# Patient Record
Sex: Female | Born: 1985 | Race: Black or African American | Hispanic: No | Marital: Single | Smoking: Never smoker
Health system: Southern US, Community
[De-identification: ages and names within clinical notes are randomized; demographics above are authoritative.]

## PROBLEM LIST (undated history)

## (undated) DIAGNOSIS — D571 Sickle-cell disease without crisis: Secondary | ICD-10-CM

## (undated) DIAGNOSIS — I10 Essential (primary) hypertension: Secondary | ICD-10-CM

## (undated) DIAGNOSIS — I639 Cerebral infarction, unspecified: Secondary | ICD-10-CM

---
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
## 2009-05-11 ENCOUNTER — Inpatient Hospital Stay (HOSPITAL_COMMUNITY): Admission: EM | Admit: 2009-05-11 | Discharge: 2009-05-13 | Payer: Self-pay | Admitting: Emergency Medicine

## 2010-05-23 LAB — CBC
HCT: 19.6 % — ABNORMAL LOW (ref 36.0–46.0)
HCT: 25.7 % — ABNORMAL LOW (ref 36.0–46.0)
Hemoglobin: 5.4 g/dL — CL (ref 12.0–15.0)
Hemoglobin: 6.6 g/dL — CL (ref 12.0–15.0)
Hemoglobin: 8.7 g/dL — ABNORMAL LOW (ref 12.0–15.0)
MCHC: 33.5 g/dL (ref 30.0–36.0)
MCHC: 34.1 g/dL (ref 30.0–36.0)
MCV: 106.1 fL — ABNORMAL HIGH (ref 78.0–100.0)
MCV: 106.7 fL — ABNORMAL HIGH (ref 78.0–100.0)
Platelets: 258 10*3/uL (ref 150–400)
Platelets: 331 10*3/uL (ref 150–400)
RBC: 1.5 MIL/uL — ABNORMAL LOW (ref 3.87–5.11)
RBC: 1.85 MIL/uL — ABNORMAL LOW (ref 3.87–5.11)
RDW: 20.2 % — ABNORMAL HIGH (ref 11.5–15.5)
RDW: 20.4 % — ABNORMAL HIGH (ref 11.5–15.5)
WBC: 12.4 10*3/uL — ABNORMAL HIGH (ref 4.0–10.5)
WBC: 14.1 10*3/uL — ABNORMAL HIGH (ref 4.0–10.5)
WBC: 18.3 10*3/uL — ABNORMAL HIGH (ref 4.0–10.5)

## 2010-05-23 LAB — CULTURE, BLOOD (ROUTINE X 2)

## 2010-05-23 LAB — DIFFERENTIAL
Basophils Absolute: 0 10*3/uL (ref 0.0–0.1)
Basophils Absolute: 0.1 10*3/uL (ref 0.0–0.1)
Basophils Relative: 0 % (ref 0–1)
Eosinophils Absolute: 0 10*3/uL (ref 0.0–0.7)
Eosinophils Relative: 0 % (ref 0–5)
Eosinophils Relative: 1 % (ref 0–5)
Lymphocytes Relative: 2 % — ABNORMAL LOW (ref 12–46)
Lymphocytes Relative: 34 % (ref 12–46)
Lymphs Abs: 0.4 10*3/uL — ABNORMAL LOW (ref 0.7–4.0)
Lymphs Abs: 4.8 10*3/uL — ABNORMAL HIGH (ref 0.7–4.0)
Monocytes Absolute: 1.8 10*3/uL — ABNORMAL HIGH (ref 0.1–1.0)
Monocytes Relative: 10 % (ref 3–12)
Monocytes Relative: 13 % — ABNORMAL HIGH (ref 3–12)
Neutro Abs: 16.1 10*3/uL — ABNORMAL HIGH (ref 1.7–7.7)
Neutro Abs: 7.3 10*3/uL (ref 1.7–7.7)
Neutrophils Relative %: 88 % — ABNORMAL HIGH (ref 43–77)

## 2010-05-23 LAB — URINALYSIS, ROUTINE W REFLEX MICROSCOPIC
Bilirubin Urine: NEGATIVE
Glucose, UA: NEGATIVE mg/dL
Ketones, ur: NEGATIVE mg/dL
Nitrite: NEGATIVE
pH: 7.5 (ref 5.0–8.0)

## 2010-05-23 LAB — CROSSMATCH

## 2010-05-23 LAB — COMPREHENSIVE METABOLIC PANEL
ALT: 20 U/L (ref 0–35)
ALT: 44 U/L — ABNORMAL HIGH (ref 0–35)
AST: 47 U/L — ABNORMAL HIGH (ref 0–37)
Albumin: 3.8 g/dL (ref 3.5–5.2)
Albumin: 4.5 g/dL (ref 3.5–5.2)
Alkaline Phosphatase: 38 U/L — ABNORMAL LOW (ref 39–117)
Alkaline Phosphatase: 39 U/L (ref 39–117)
BUN: 5 mg/dL — ABNORMAL LOW (ref 6–23)
BUN: 8 mg/dL (ref 6–23)
CO2: 26 mEq/L (ref 19–32)
Calcium: 9.3 mg/dL (ref 8.4–10.5)
Chloride: 106 mEq/L (ref 96–112)
Chloride: 106 mEq/L (ref 96–112)
Creatinine, Ser: 0.64 mg/dL (ref 0.4–1.2)
GFR calc Af Amer: >60 mL/min (ref >60)
GFR calc non Af Amer: >60 mL/min (ref >60)
Glucose, Bld: 132 mg/dL — ABNORMAL HIGH (ref 70–99)
Potassium: 3.7 mEq/L (ref 3.5–5.1)
Potassium: 3.9 mEq/L (ref 3.5–5.1)
Sodium: 139 mEq/L (ref 135–145)
Sodium: 141 mEq/L (ref 135–145)
Total Bilirubin: 7.4 mg/dL — ABNORMAL HIGH (ref 0.3–1.2)
Total Bilirubin: 9.5 mg/dL — ABNORMAL HIGH (ref 0.3–1.2)
Total Protein: 7 g/dL (ref 6.0–8.3)
Total Protein: 7.5 g/dL (ref 6.0–8.3)

## 2010-05-23 LAB — IRON AND TIBC: TIBC: 234 ug/dL — ABNORMAL LOW (ref 250–470)

## 2010-05-23 LAB — LIPASE, BLOOD: Lipase: 23 U/L (ref 11–59)

## 2010-05-23 LAB — VITAMIN B12: Vitamin B-12: 820 pg/mL (ref 211–911)

## 2010-05-23 LAB — RETICULOCYTES
RBC.: 2.69 MIL/uL — ABNORMAL LOW (ref 3.87–5.11)
Retic Count, Absolute: 133.5 10*3/uL (ref 19.0–186.0)
Retic Count, Absolute: 349.7 10*3/uL — ABNORMAL HIGH (ref 19.0–186.0)
Retic Ct Pct: 7.1 % — ABNORMAL HIGH (ref 0.4–3.1)
Retic Ct Pct: 13 % — ABNORMAL HIGH (ref 0.4–3.1)

## 2010-05-23 LAB — PATHOLOGIST SMEAR REVIEW

## 2010-05-23 LAB — LACTIC ACID, PLASMA: Lactic Acid, Venous: 1.9 mmol/L (ref 0.5–2.2)

## 2010-05-23 LAB — URINE CULTURE

## 2010-05-23 LAB — URINE MICROSCOPIC-ADD ON

## 2010-05-23 LAB — FOLATE: Folate: 14.9 ng/mL

## 2010-05-23 LAB — FERRITIN: Ferritin: 371 ng/mL — ABNORMAL HIGH (ref 10–291)

## 2010-12-09 IMAGING — CR DG ABDOMEN ACUTE W/ 1V CHEST
3 series · 3 of 3 positions shown · non-contrast
Comparison: None.

CLINICAL DATA: Nausea.  Vomiting.  Diarrhea.  Abdominal pain.
History of splenectomy.

ACUTE ABDOMEN SERIES (ABDOMEN 2 VIEW & CHEST 1 VIEW)

[w chest pa]
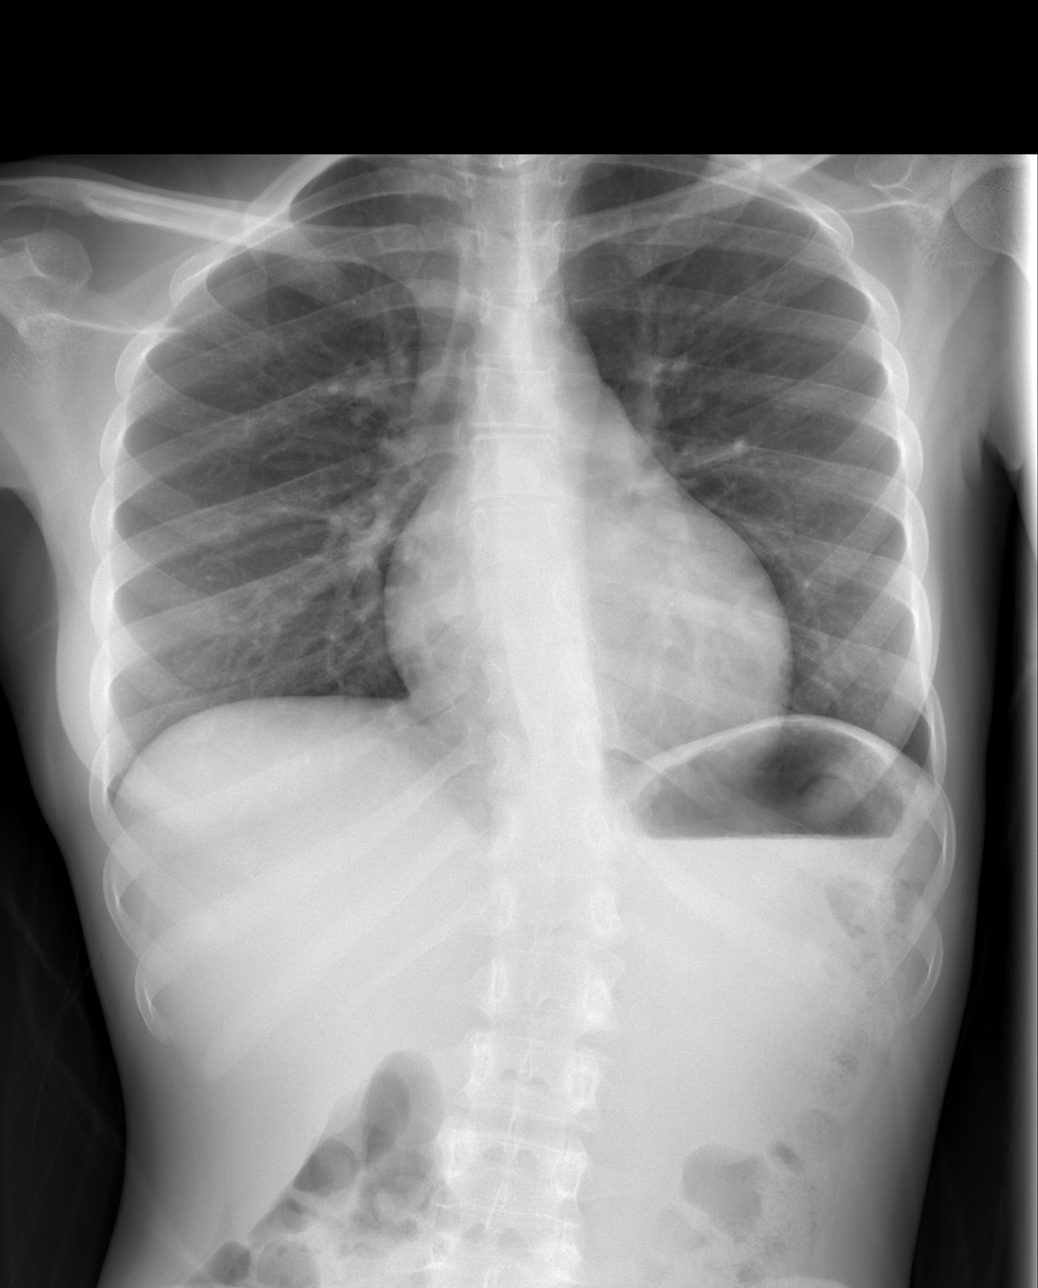

[w abdomen upright *]
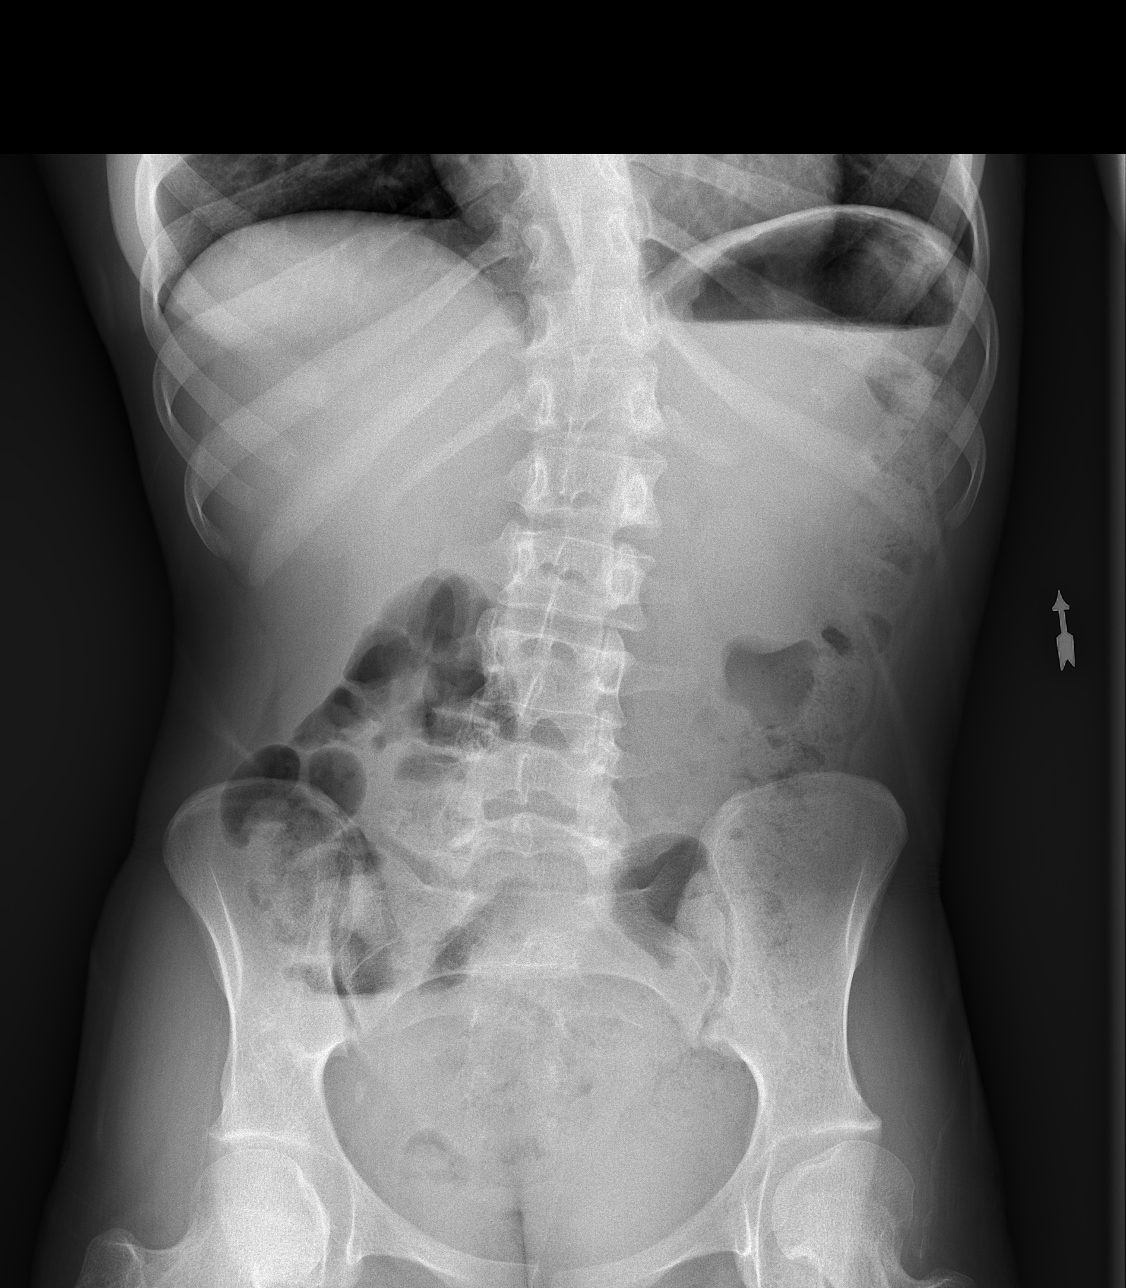

[t abdomen supine]
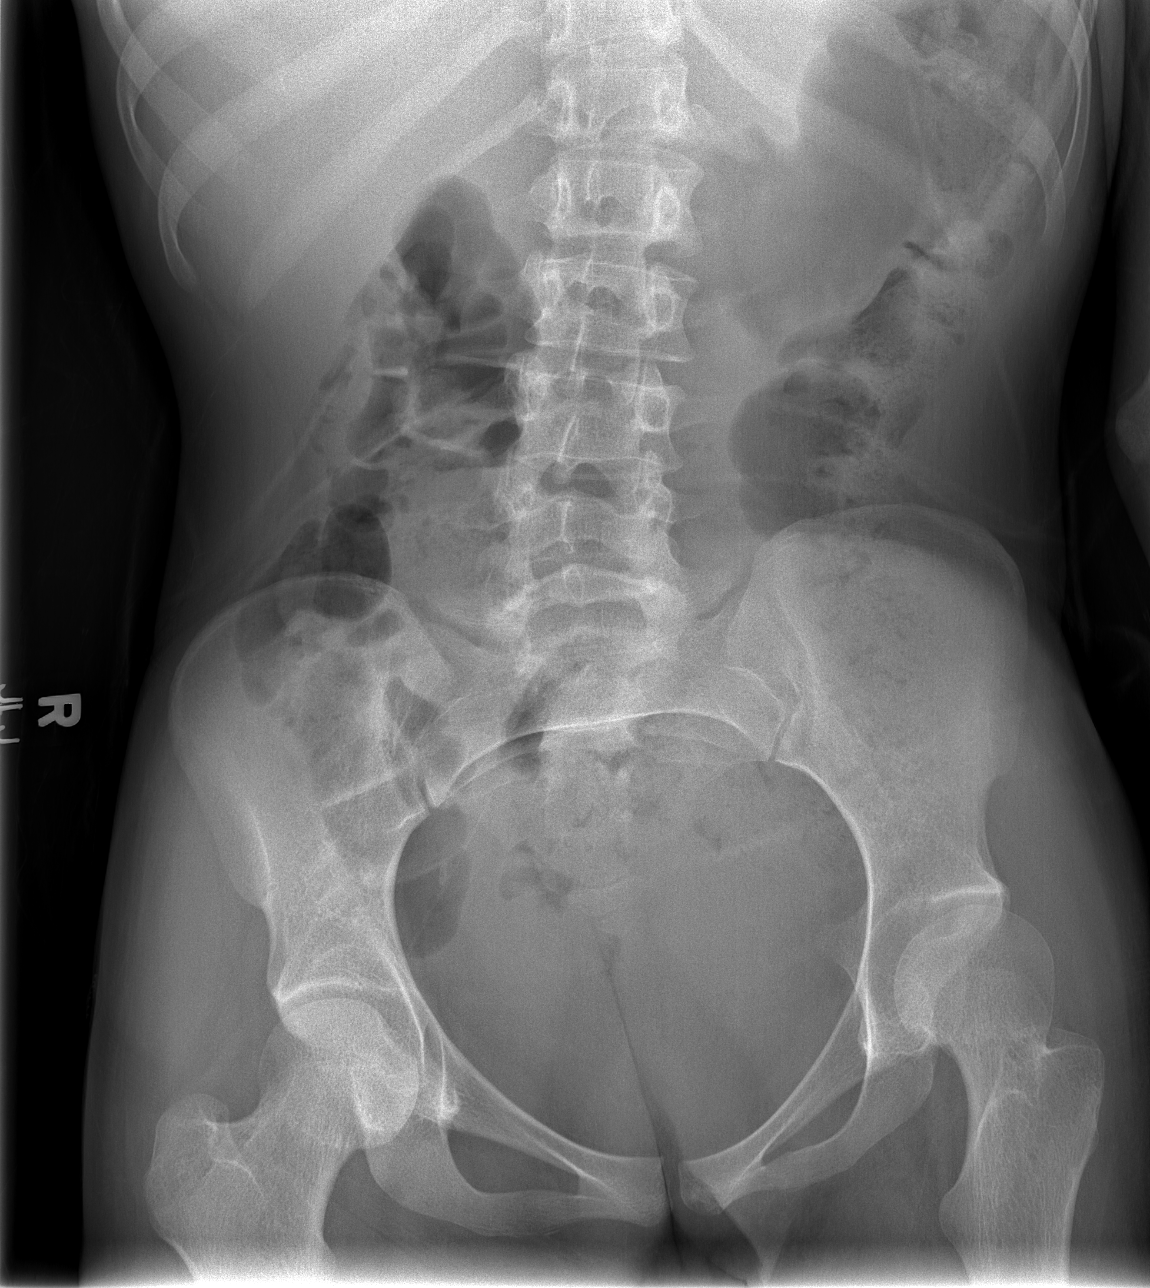

[3 of 3 positions shown; findings below may reference images not displayed]

FINDINGS: The cardiac silhouette is normal size and shape. There is
thoracolumbar scoliosis with primary lumbar curve convexity to the
left. The lungs are well aerated and free of infiltrates. No
pleural abnormality is evident.

No pneumoperitoneum is seen.  There is fluid distention of the
stomach.  The bowel gas pattern of the colon and small intestine is
nonobstructive.  No opaque calculus or abnormal calcifications are
evident.  Small phlebolith is seen in the right side of the pelvis.
There is a paucity of bowel gas in the pelvis.  I cannot exclude a
pelvic process.
IMPRESSION: Scoliosis.  No cardiopulmonary disease is evident.

No pneumoperitoneum is evident.  There is fluid distention of the
stomach.  Colonic and small intestinal bowel gas pattern is
nonobstructive.  No opaque calculus is seen.  There is a paucity of
bowel gas in the pelvis.  I cannot exclude a pelvic process.

## 2012-10-07 ENCOUNTER — Encounter (HOSPITAL_COMMUNITY): Payer: Self-pay | Admitting: Emergency Medicine

## 2012-10-07 ENCOUNTER — Encounter (HOSPITAL_COMMUNITY): Payer: Self-pay

## 2012-10-07 ENCOUNTER — Emergency Department (HOSPITAL_COMMUNITY)
Admission: EM | Admit: 2012-10-07 | Discharge: 2012-10-07 | Discharge status: Against Medical Advice | Payer: Medicaid Other | Attending: Emergency Medicine | Admitting: Emergency Medicine

## 2012-10-07 ENCOUNTER — Emergency Department (INDEPENDENT_AMBULATORY_CARE_PROVIDER_SITE_OTHER)
Admission: EM | Admit: 2012-10-07 | Discharge: 2012-10-07 | Disposition: A | Discharge status: Home / Self Care | Payer: Medicaid Other | Source: Home / Self Care

## 2012-10-07 DIAGNOSIS — D62 Acute posthemorrhagic anemia: Secondary | ICD-10-CM

## 2012-10-07 DIAGNOSIS — Z8673 Personal history of transient ischemic attack (TIA), and cerebral infarction without residual deficits: Secondary | ICD-10-CM | POA: Insufficient documentation

## 2012-10-07 DIAGNOSIS — D571 Sickle-cell disease without crisis: Secondary | ICD-10-CM | POA: Insufficient documentation

## 2012-10-07 DIAGNOSIS — R5381 Other malaise: Secondary | ICD-10-CM | POA: Insufficient documentation

## 2012-10-07 DIAGNOSIS — N898 Other specified noninflammatory disorders of vagina: Secondary | ICD-10-CM | POA: Insufficient documentation

## 2012-10-07 DIAGNOSIS — D5700 Hb-SS disease with crisis, unspecified: Secondary | ICD-10-CM

## 2012-10-07 HISTORY — DX: Cerebral infarction, unspecified: I63.9

## 2012-10-07 HISTORY — DX: Essential (primary) hypertension: I10

## 2012-10-07 HISTORY — DX: Sickle-cell disease without crisis: D57.1

## 2012-10-07 LAB — POCT URINALYSIS DIP (DEVICE)
Bilirubin Urine: NEGATIVE
Ketones, ur: NEGATIVE mg/dL
Protein, ur: 100 mg/dL — AB
Specific Gravity, Urine: 1.015 (ref 1.005–1.030)
pH: 7 (ref 5.0–8.0)

## 2012-10-07 LAB — POCT I-STAT, CHEM 8
BUN: 5 mg/dL — ABNORMAL LOW (ref 6–23)
Calcium, Ion: 1.28 mmol/L — ABNORMAL HIGH (ref 1.12–1.23)
Chloride: 108 mEq/L (ref 96–112)
Creatinine, Ser: 1.1 mg/dL (ref 0.50–1.10)
Glucose, Bld: 104 mg/dL — ABNORMAL HIGH (ref 70–99)
HCT: 25 % — ABNORMAL LOW (ref 36.0–46.0)
Potassium: 3.8 mEq/L (ref 3.5–5.1)

## 2012-10-07 NOTE — ED Notes (Signed)
Mother came to nurse first desk x2. RN explained to mother that she was concerned about daughter and her weakness and vaginal bleeding, but daughter was at this point stable and RN will continue to monitor. Mother angry that TRiage RN did not ask enough questions. Mother states, "she did not ask [place] enough questions she just got what she wanted from the paper. She does not know how weak and sick my daughter is. She had a stroke and she has sickle cell and her hemoglobin is 8.5 and you people are not concerned" RN explained the we are concerned and that she will continue to monitor patients condition. Mother states, "you are treated like I am naive and I am not naive. I have a masters degree and I am registered dietician. IT is because I have an accent." Rn apologized and explained that she was trying to explain things the way she does to everyone whether they are medical or not. RN offered to get charge nurse. Mother was angry and left.

## 2012-10-07 NOTE — ED Notes (Signed)
States she missed her appt for depo at [facility], and states she has has been having  Bleeding for 2 weeks , and she is feeling weak and tired; history of sickle cell and stroke

## 2012-10-07 NOTE — ED Notes (Signed)
Mother stated, she was sent down here cause she has had prolonged vaginal bleeding.

## 2012-10-07 NOTE — ED Provider Notes (Signed)
CSN: [id number]     Arrival date & time 10/07/12  1135 History     First MD Initiated Contact with Patient 10/07/12 1240     Chief Complaint  Patient presents with  . Menstrual Problem   (Consider location/radiation/quality/duration/timing/severity/associated sxs/prior Treatment) HPI Comments: 27 year old female presents for evaluation of weakness, fatigue, and dizziness. [facility] and therefore missed her Depo Provera shot. Her period has been going on for 2 weeks now. Yesterday and today, she has begun to feel very weak and has had some episodes of dizziness. She has a history of sickle cell anemia but is not currently in any pain. She states that her period has slowed down and may have stopped but she still feels her weak and dizzy. Denies chest pain, headache, shortness of breath, abdominal pain   Past Medical History  Diagnosis Date  . Sickle cell disease   . Stroke   . Hypertension    History reviewed. No pertinent past surgical history. History reviewed. No pertinent family history. History  Substance Use Topics  . Smoking status: Not on file  . Smokeless tobacco: Not on file  . Alcohol Use: Not on file   OB History   Grav Para Term Preterm Abortions TAB SAB Ect Mult Living                 Review of Systems  Constitutional: Positive for fatigue. Negative for fever and chills.  Eyes: Negative for visual disturbance.  Respiratory: Negative for cough and shortness of breath.   Cardiovascular: Negative for chest pain, palpitations and leg swelling.  Gastrointestinal: Negative for nausea, vomiting and abdominal pain.  Endocrine: Negative for polydipsia and polyuria.  Genitourinary: Positive for menstrual problem. Negative for dysuria, urgency and frequency.  Musculoskeletal: Negative for myalgias and arthralgias.  Skin: Negative for rash.  Neurological: Positive for dizziness, weakness and light-headedness.    Allergies  Review of  patient's allergies indicates no known allergies.  Home Medications   Current Outpatient Rx  Name  Route  Sig  Dispense  Refill  . enalapril (VASOTEC) 10 MG tablet   Oral   Take 10 mg by mouth daily.         . folic acid (FOLVITE) 1 MG tablet   Oral   Take 1 mg by mouth daily.         . hydroxyurea (HYDREA) 500 MG capsule   Oral   Take 500 mg by mouth daily. May take with food to minimize GI side effects.         . risperiDONE (RISPERDAL) 0.25 MG tablet   Oral   Take 0.25 mg by mouth 2 (two) times daily.          BP 130/82  Pulse 121  Temp(Src) 98.3 F (36.8 C) (Oral)  Resp 12  SpO2 100% Physical Exam  Nursing note and vitals reviewed. Constitutional: She is oriented to person, place, and time. Vital signs are normal. She appears well-developed and well-nourished. No distress.  HENT:  Head: Atraumatic.  Eyes: EOM are normal. Pupils are equal, round, and reactive to light.  The conjunctiva are slightly pale  Cardiovascular: Normal rate, regular rhythm and normal heart sounds.  Exam reveals no gallop and no friction rub.   No murmur heard. Pulmonary/Chest: Effort normal and breath sounds normal. No respiratory distress. She has no wheezes. She has no rales.  Abdominal: Soft. There is no tenderness.  Neurological: She is alert and oriented  to person, place, and time. She has normal strength.  Skin: Skin is warm and dry. She is not diaphoretic.  Psychiatric: Her behavior is normal. Judgment normal. Her affect is blunt (strange, slightly blunted affect, this is normal for the patient).    ED Course   Procedures (including critical care time)  Labs Reviewed  POCT URINALYSIS DIP (DEVICE) - Abnormal; Notable for the following:    Hgb urine dipstick LARGE (*)    Protein, ur 100 (*)    Urobilinogen, UA 2.0 (*)    Leukocytes, UA TRACE (*)    All other components within normal limits  POCT I-STAT, CHEM 8 - Abnormal; Notable for the following:    BUN 5 (*)     Glucose, Bld 104 (*)    Calcium, Ion 1.28 (*)    Hemoglobin 8.5 (*)    HCT 25.0 (*)    All other components within normal limits  POCT PREGNANCY, URINE   No results found. 1. Sickle cell anemia, with unspecified crisis   2. Acute blood loss anemia     MDM  The hemoglobin is 8.5, probably a bit lower than that because our i-STAT machine reads a bit high. She is orthostatic as well. She is being transferred to the emergency department   Graylon Good, PA-C 10/07/12 1435

## 2012-10-07 NOTE — ED Notes (Signed)
Did not answer when called x2 

## 2012-10-10 NOTE — ED Provider Notes (Signed)
Medical screening examination/treatment/procedure(s) were performed by a resident physician or non-physician practitioner and as the supervising physician I was immediately available for consultation/collaboration.  Clementeen Graham, MD   Rodolph Bong, MD 10/10/12 [phone]

## 2016-05-16 DIAGNOSIS — F29 Unspecified psychosis not due to a substance or known physiological condition: Secondary | ICD-10-CM | POA: Diagnosis not present

## 2016-08-10 DIAGNOSIS — R110 Nausea: Secondary | ICD-10-CM | POA: Diagnosis not present

## 2016-08-10 DIAGNOSIS — D571 Sickle-cell disease without crisis: Secondary | ICD-10-CM | POA: Diagnosis not present

## 2016-08-10 DIAGNOSIS — R808 Other proteinuria: Secondary | ICD-10-CM | POA: Diagnosis not present

## 2016-08-10 DIAGNOSIS — Z79899 Other long term (current) drug therapy: Secondary | ICD-10-CM | POA: Diagnosis not present

## 2017-05-29 DIAGNOSIS — Z79899 Other long term (current) drug therapy: Secondary | ICD-10-CM | POA: Diagnosis not present

## 2017-05-29 DIAGNOSIS — R801 Persistent proteinuria, unspecified: Secondary | ICD-10-CM | POA: Diagnosis not present

## 2017-05-29 DIAGNOSIS — F329 Major depressive disorder, single episode, unspecified: Secondary | ICD-10-CM | POA: Diagnosis not present

## 2017-05-29 DIAGNOSIS — R808 Other proteinuria: Secondary | ICD-10-CM | POA: Diagnosis not present

## 2017-05-29 DIAGNOSIS — E559 Vitamin D deficiency, unspecified: Secondary | ICD-10-CM | POA: Diagnosis not present

## 2017-05-29 DIAGNOSIS — Z8673 Personal history of transient ischemic attack (TIA), and cerebral infarction without residual deficits: Secondary | ICD-10-CM | POA: Diagnosis not present

## 2017-05-29 DIAGNOSIS — D571 Sickle-cell disease without crisis: Secondary | ICD-10-CM | POA: Diagnosis not present

## 2017-05-29 DIAGNOSIS — R809 Proteinuria, unspecified: Secondary | ICD-10-CM | POA: Diagnosis not present

## 2017-05-29 DIAGNOSIS — Z9081 Acquired absence of spleen: Secondary | ICD-10-CM | POA: Diagnosis not present

## 2017-05-29 DIAGNOSIS — Z23 Encounter for immunization: Secondary | ICD-10-CM | POA: Diagnosis not present

## 2017-05-29 DIAGNOSIS — Z5181 Encounter for therapeutic drug level monitoring: Secondary | ICD-10-CM | POA: Diagnosis not present

## 2017-07-31 DIAGNOSIS — Z79899 Other long term (current) drug therapy: Secondary | ICD-10-CM | POA: Diagnosis not present

## 2017-07-31 DIAGNOSIS — Z9081 Acquired absence of spleen: Secondary | ICD-10-CM | POA: Diagnosis not present

## 2017-07-31 DIAGNOSIS — E559 Vitamin D deficiency, unspecified: Secondary | ICD-10-CM | POA: Diagnosis not present

## 2017-07-31 DIAGNOSIS — Z8673 Personal history of transient ischemic attack (TIA), and cerebral infarction without residual deficits: Secondary | ICD-10-CM | POA: Diagnosis not present

## 2017-07-31 DIAGNOSIS — Z5181 Encounter for therapeutic drug level monitoring: Secondary | ICD-10-CM | POA: Diagnosis not present

## 2017-07-31 DIAGNOSIS — F329 Major depressive disorder, single episode, unspecified: Secondary | ICD-10-CM | POA: Diagnosis not present

## 2017-07-31 DIAGNOSIS — D571 Sickle-cell disease without crisis: Secondary | ICD-10-CM | POA: Diagnosis not present

## 2017-07-31 DIAGNOSIS — R808 Other proteinuria: Secondary | ICD-10-CM | POA: Diagnosis not present

## 2017-07-31 DIAGNOSIS — F39 Unspecified mood [affective] disorder: Secondary | ICD-10-CM | POA: Diagnosis not present

## 2017-07-31 DIAGNOSIS — Z23 Encounter for immunization: Secondary | ICD-10-CM | POA: Diagnosis not present

## 2017-08-29 DIAGNOSIS — Z5181 Encounter for therapeutic drug level monitoring: Secondary | ICD-10-CM | POA: Diagnosis not present

## 2017-08-29 DIAGNOSIS — D571 Sickle-cell disease without crisis: Secondary | ICD-10-CM | POA: Diagnosis not present

## 2017-09-18 DIAGNOSIS — F89 Unspecified disorder of psychological development: Secondary | ICD-10-CM | POA: Diagnosis not present

## 2017-09-18 DIAGNOSIS — F251 Schizoaffective disorder, depressive type: Secondary | ICD-10-CM | POA: Diagnosis not present

## 2017-10-24 DIAGNOSIS — D571 Sickle-cell disease without crisis: Secondary | ICD-10-CM | POA: Diagnosis not present

## 2017-10-24 DIAGNOSIS — Z8659 Personal history of other mental and behavioral disorders: Secondary | ICD-10-CM | POA: Diagnosis not present

## 2017-10-24 DIAGNOSIS — R808 Other proteinuria: Secondary | ICD-10-CM | POA: Diagnosis not present

## 2017-10-24 DIAGNOSIS — N051 Unspecified nephritic syndrome with focal and segmental glomerular lesions: Secondary | ICD-10-CM | POA: Diagnosis not present

## 2017-10-30 DIAGNOSIS — F89 Unspecified disorder of psychological development: Secondary | ICD-10-CM | POA: Diagnosis not present

## 2017-10-30 DIAGNOSIS — F251 Schizoaffective disorder, depressive type: Secondary | ICD-10-CM | POA: Diagnosis not present

## 2017-11-13 DIAGNOSIS — F329 Major depressive disorder, single episode, unspecified: Secondary | ICD-10-CM | POA: Diagnosis not present

## 2017-11-13 DIAGNOSIS — R808 Other proteinuria: Secondary | ICD-10-CM | POA: Diagnosis not present

## 2017-11-13 DIAGNOSIS — F39 Unspecified mood [affective] disorder: Secondary | ICD-10-CM | POA: Diagnosis not present

## 2017-11-13 DIAGNOSIS — Z79899 Other long term (current) drug therapy: Secondary | ICD-10-CM | POA: Diagnosis not present

## 2017-11-13 DIAGNOSIS — Z23 Encounter for immunization: Secondary | ICD-10-CM | POA: Diagnosis not present

## 2017-11-13 DIAGNOSIS — Z9081 Acquired absence of spleen: Secondary | ICD-10-CM | POA: Diagnosis not present

## 2017-11-13 DIAGNOSIS — E559 Vitamin D deficiency, unspecified: Secondary | ICD-10-CM | POA: Diagnosis not present

## 2017-11-13 DIAGNOSIS — D571 Sickle-cell disease without crisis: Secondary | ICD-10-CM | POA: Diagnosis not present

## 2017-11-13 DIAGNOSIS — Z5181 Encounter for therapeutic drug level monitoring: Secondary | ICD-10-CM | POA: Diagnosis not present

## 2017-12-26 DIAGNOSIS — F251 Schizoaffective disorder, depressive type: Secondary | ICD-10-CM | POA: Diagnosis not present

## 2017-12-26 DIAGNOSIS — F89 Unspecified disorder of psychological development: Secondary | ICD-10-CM | POA: Diagnosis not present

## 2017-12-29 DIAGNOSIS — Z23 Encounter for immunization: Secondary | ICD-10-CM | POA: Diagnosis not present

## 2018-01-09 DIAGNOSIS — D571 Sickle-cell disease without crisis: Secondary | ICD-10-CM | POA: Diagnosis not present

## 2018-01-09 DIAGNOSIS — H36 Retinal disorders in diseases classified elsewhere: Secondary | ICD-10-CM | POA: Diagnosis not present

## 2018-01-09 DIAGNOSIS — H5213 Myopia, bilateral: Secondary | ICD-10-CM | POA: Diagnosis not present

## 2018-01-30 DIAGNOSIS — D571 Sickle-cell disease without crisis: Secondary | ICD-10-CM | POA: Diagnosis not present

## 2018-01-30 DIAGNOSIS — Z5181 Encounter for therapeutic drug level monitoring: Secondary | ICD-10-CM | POA: Diagnosis not present

## 2018-01-30 DIAGNOSIS — H36 Retinal disorders in diseases classified elsewhere: Secondary | ICD-10-CM | POA: Diagnosis not present

## 2018-01-30 DIAGNOSIS — N051 Unspecified nephritic syndrome with focal and segmental glomerular lesions: Secondary | ICD-10-CM | POA: Diagnosis not present

## 2018-06-13 DIAGNOSIS — F251 Schizoaffective disorder, depressive type: Secondary | ICD-10-CM | POA: Diagnosis not present

## 2019-05-15 ENCOUNTER — Ambulatory Visit: Payer: Medicare HMO | Attending: Family

## 2019-05-15 DIAGNOSIS — Z23 Encounter for immunization: Secondary | ICD-10-CM

## 2019-05-15 NOTE — Progress Notes (Signed)
   Covid-19 Vaccination Clinic  Name:  Jo Ballard    MRN: [id number] DOB: Nov 13, 1985  05/15/2019  [name] was observed post Covid-19 immunization for 15 minutes without incident. She was provided with Vaccine Information Sheet and instruction to access the V-Safe system.   [name] was instructed to call 911 with any severe reactions post vaccine: Marland Kitchen Difficulty breathing  . Swelling of face and throat  . A fast heartbeat  . A bad rash all over body  . Dizziness and weakness   Immunizations Administered    Name Date Dose VIS Date Route   Moderna COVID-19 Vaccine 05/15/2019  2:39 PM 0.5 mL 01/28/2019 Intramuscular   Manufacturer: Moderna   Lot: [id number]   NDC: 85277-824-23

## 2019-06-17 ENCOUNTER — Ambulatory Visit: Payer: Medicare HMO | Attending: Family

## 2019-06-17 DIAGNOSIS — Z23 Encounter for immunization: Secondary | ICD-10-CM

## 2019-06-17 NOTE — Progress Notes (Signed)
   Covid-19 Vaccination Clinic  Name:  [name]    MRN: [id number] DOB: Dec 23, 1985  06/17/2019  Jo Ballard was observed post Covid-19 immunization for 15 minutes without incident. She was provided with Vaccine Information Sheet and instruction to access the V-Safe system.   Ms. Patella was instructed to call 911 with any severe reactions post vaccine: Marland Kitchen Difficulty breathing  . Swelling of face and throat  . A fast heartbeat  . A bad rash all over body  . Dizziness and weakness   Immunizations Administered    Name Date Dose VIS Date Route   Moderna COVID-19 Vaccine 06/17/2019  1:57 PM 0.5 mL 01/2019 Intramuscular   Manufacturer: Moderna   Lot: [id number]   NDC: 18563-149-70

## 2020-03-23 DIAGNOSIS — Z5181 Encounter for therapeutic drug level monitoring: Secondary | ICD-10-CM | POA: Diagnosis not present

## 2020-03-23 DIAGNOSIS — R808 Other proteinuria: Secondary | ICD-10-CM | POA: Diagnosis not present

## 2020-03-23 DIAGNOSIS — D571 Sickle-cell disease without crisis: Secondary | ICD-10-CM | POA: Diagnosis not present

## 2020-03-23 DIAGNOSIS — M8588 Other specified disorders of bone density and structure, other site: Secondary | ICD-10-CM | POA: Diagnosis not present

## 2020-05-05 DIAGNOSIS — F419 Anxiety disorder, unspecified: Secondary | ICD-10-CM | POA: Diagnosis not present

## 2020-05-05 DIAGNOSIS — R69 Illness, unspecified: Secondary | ICD-10-CM | POA: Diagnosis not present

## 2020-06-16 DIAGNOSIS — Z5181 Encounter for therapeutic drug level monitoring: Secondary | ICD-10-CM | POA: Diagnosis not present

## 2020-06-16 DIAGNOSIS — R808 Other proteinuria: Secondary | ICD-10-CM | POA: Diagnosis not present

## 2020-06-16 DIAGNOSIS — D571 Sickle-cell disease without crisis: Secondary | ICD-10-CM | POA: Diagnosis not present

## 2020-07-29 DIAGNOSIS — H5213 Myopia, bilateral: Secondary | ICD-10-CM | POA: Diagnosis not present

## 2020-07-29 DIAGNOSIS — H52223 Regular astigmatism, bilateral: Secondary | ICD-10-CM | POA: Diagnosis not present

## 2020-07-30 DIAGNOSIS — H5213 Myopia, bilateral: Secondary | ICD-10-CM | POA: Diagnosis not present

## 2020-08-10 DIAGNOSIS — R69 Illness, unspecified: Secondary | ICD-10-CM | POA: Diagnosis not present

## 2020-08-10 DIAGNOSIS — F419 Anxiety disorder, unspecified: Secondary | ICD-10-CM | POA: Diagnosis not present

## 2020-08-16 DIAGNOSIS — H5213 Myopia, bilateral: Secondary | ICD-10-CM | POA: Diagnosis not present

## 2020-08-16 DIAGNOSIS — H52223 Regular astigmatism, bilateral: Secondary | ICD-10-CM | POA: Diagnosis not present

## 2020-08-18 DIAGNOSIS — D571 Sickle-cell disease without crisis: Secondary | ICD-10-CM | POA: Diagnosis not present

## 2020-08-18 DIAGNOSIS — R7303 Prediabetes: Secondary | ICD-10-CM | POA: Diagnosis not present

## 2020-08-18 DIAGNOSIS — Z5181 Encounter for therapeutic drug level monitoring: Secondary | ICD-10-CM | POA: Diagnosis not present

## 2020-08-18 DIAGNOSIS — R808 Other proteinuria: Secondary | ICD-10-CM | POA: Diagnosis not present

## 2020-11-03 DIAGNOSIS — F251 Schizoaffective disorder, depressive type: Secondary | ICD-10-CM | POA: Diagnosis not present

## 2020-11-03 DIAGNOSIS — R69 Illness, unspecified: Secondary | ICD-10-CM | POA: Diagnosis not present

## 2020-11-03 DIAGNOSIS — F419 Anxiety disorder, unspecified: Secondary | ICD-10-CM | POA: Diagnosis not present

## 2020-11-25 DIAGNOSIS — Z9081 Acquired absence of spleen: Secondary | ICD-10-CM | POA: Diagnosis not present

## 2020-11-25 DIAGNOSIS — D571 Sickle-cell disease without crisis: Secondary | ICD-10-CM | POA: Diagnosis not present

## 2020-11-25 DIAGNOSIS — Z8673 Personal history of transient ischemic attack (TIA), and cerebral infarction without residual deficits: Secondary | ICD-10-CM | POA: Diagnosis not present

## 2020-11-25 DIAGNOSIS — R7303 Prediabetes: Secondary | ICD-10-CM | POA: Diagnosis not present

## 2020-11-25 DIAGNOSIS — R808 Other proteinuria: Secondary | ICD-10-CM | POA: Diagnosis not present

## 2020-11-25 DIAGNOSIS — M8580 Other specified disorders of bone density and structure, unspecified site: Secondary | ICD-10-CM | POA: Diagnosis not present

## 2020-11-25 DIAGNOSIS — E559 Vitamin D deficiency, unspecified: Secondary | ICD-10-CM | POA: Diagnosis not present

## 2020-11-25 DIAGNOSIS — R69 Illness, unspecified: Secondary | ICD-10-CM | POA: Diagnosis not present

## 2020-11-25 DIAGNOSIS — Z5181 Encounter for therapeutic drug level monitoring: Secondary | ICD-10-CM | POA: Diagnosis not present

## 2020-11-25 DIAGNOSIS — Z23 Encounter for immunization: Secondary | ICD-10-CM | POA: Diagnosis not present

## 2020-12-01 DIAGNOSIS — D571 Sickle-cell disease without crisis: Secondary | ICD-10-CM | POA: Diagnosis not present

## 2020-12-08 DIAGNOSIS — N182 Chronic kidney disease, stage 2 (mild): Secondary | ICD-10-CM | POA: Diagnosis not present

## 2020-12-08 DIAGNOSIS — D571 Sickle-cell disease without crisis: Secondary | ICD-10-CM | POA: Diagnosis not present

## 2020-12-08 DIAGNOSIS — N051 Unspecified nephritic syndrome with focal and segmental glomerular lesions: Secondary | ICD-10-CM | POA: Diagnosis not present

## 2020-12-08 DIAGNOSIS — R801 Persistent proteinuria, unspecified: Secondary | ICD-10-CM | POA: Diagnosis not present

## 2020-12-08 DIAGNOSIS — R808 Other proteinuria: Secondary | ICD-10-CM | POA: Diagnosis not present

## 2020-12-16 DIAGNOSIS — D571 Sickle-cell disease without crisis: Secondary | ICD-10-CM | POA: Diagnosis not present

## 2020-12-16 DIAGNOSIS — N051 Unspecified nephritic syndrome with focal and segmental glomerular lesions: Secondary | ICD-10-CM | POA: Diagnosis not present

## 2020-12-16 DIAGNOSIS — R808 Other proteinuria: Secondary | ICD-10-CM | POA: Diagnosis not present

## 2021-01-26 DIAGNOSIS — R69 Illness, unspecified: Secondary | ICD-10-CM | POA: Diagnosis not present

## 2021-01-26 DIAGNOSIS — F419 Anxiety disorder, unspecified: Secondary | ICD-10-CM | POA: Diagnosis not present

## 2021-04-07 DIAGNOSIS — R69 Illness, unspecified: Secondary | ICD-10-CM | POA: Diagnosis not present

## 2021-04-07 DIAGNOSIS — F419 Anxiety disorder, unspecified: Secondary | ICD-10-CM | POA: Diagnosis not present

## 2021-04-19 DIAGNOSIS — R808 Other proteinuria: Secondary | ICD-10-CM | POA: Diagnosis not present

## 2021-04-19 DIAGNOSIS — Z8673 Personal history of transient ischemic attack (TIA), and cerebral infarction without residual deficits: Secondary | ICD-10-CM | POA: Diagnosis not present

## 2021-04-19 DIAGNOSIS — Z5181 Encounter for therapeutic drug level monitoring: Secondary | ICD-10-CM | POA: Diagnosis not present

## 2021-04-19 DIAGNOSIS — D571 Sickle-cell disease without crisis: Secondary | ICD-10-CM | POA: Diagnosis not present

## 2021-04-19 DIAGNOSIS — I519 Heart disease, unspecified: Secondary | ICD-10-CM | POA: Diagnosis not present

## 2021-06-17 DIAGNOSIS — Z5181 Encounter for therapeutic drug level monitoring: Secondary | ICD-10-CM | POA: Diagnosis not present

## 2021-06-17 DIAGNOSIS — Z131 Encounter for screening for diabetes mellitus: Secondary | ICD-10-CM | POA: Diagnosis not present

## 2021-06-17 DIAGNOSIS — I10 Essential (primary) hypertension: Secondary | ICD-10-CM | POA: Diagnosis not present

## 2021-06-17 DIAGNOSIS — I519 Heart disease, unspecified: Secondary | ICD-10-CM | POA: Diagnosis not present

## 2021-06-17 DIAGNOSIS — R808 Other proteinuria: Secondary | ICD-10-CM | POA: Diagnosis not present

## 2021-06-17 DIAGNOSIS — D571 Sickle-cell disease without crisis: Secondary | ICD-10-CM | POA: Diagnosis not present

## 2021-06-17 DIAGNOSIS — Z8673 Personal history of transient ischemic attack (TIA), and cerebral infarction without residual deficits: Secondary | ICD-10-CM | POA: Diagnosis not present

## 2021-06-20 DIAGNOSIS — F419 Anxiety disorder, unspecified: Secondary | ICD-10-CM | POA: Diagnosis not present

## 2021-06-20 DIAGNOSIS — F331 Major depressive disorder, recurrent, moderate: Secondary | ICD-10-CM | POA: Diagnosis not present

## 2021-06-20 DIAGNOSIS — F3131 Bipolar disorder, current episode depressed, mild: Secondary | ICD-10-CM | POA: Diagnosis not present

## 2021-06-20 DIAGNOSIS — R69 Illness, unspecified: Secondary | ICD-10-CM | POA: Diagnosis not present

## 2021-06-24 DIAGNOSIS — H5213 Myopia, bilateral: Secondary | ICD-10-CM | POA: Diagnosis not present

## 2021-06-24 DIAGNOSIS — Z0100 Encounter for examination of eyes and vision without abnormal findings: Secondary | ICD-10-CM | POA: Diagnosis not present

## 2021-07-29 DIAGNOSIS — R011 Cardiac murmur, unspecified: Secondary | ICD-10-CM | POA: Diagnosis not present

## 2021-07-29 DIAGNOSIS — Z9081 Acquired absence of spleen: Secondary | ICD-10-CM | POA: Diagnosis not present

## 2021-07-29 DIAGNOSIS — I081 Rheumatic disorders of both mitral and tricuspid valves: Secondary | ICD-10-CM | POA: Diagnosis not present

## 2021-07-29 DIAGNOSIS — R9431 Abnormal electrocardiogram [ECG] [EKG]: Secondary | ICD-10-CM | POA: Diagnosis not present

## 2021-07-29 DIAGNOSIS — Z79899 Other long term (current) drug therapy: Secondary | ICD-10-CM | POA: Diagnosis not present

## 2021-07-29 DIAGNOSIS — I501 Left ventricular failure: Secondary | ICD-10-CM | POA: Diagnosis not present

## 2021-07-29 DIAGNOSIS — I5022 Chronic systolic (congestive) heart failure: Secondary | ICD-10-CM | POA: Diagnosis not present

## 2021-07-29 DIAGNOSIS — I491 Atrial premature depolarization: Secondary | ICD-10-CM | POA: Diagnosis not present

## 2021-08-18 DIAGNOSIS — R808 Other proteinuria: Secondary | ICD-10-CM | POA: Diagnosis not present

## 2021-08-18 DIAGNOSIS — Z131 Encounter for screening for diabetes mellitus: Secondary | ICD-10-CM | POA: Diagnosis not present

## 2021-08-18 DIAGNOSIS — Z8673 Personal history of transient ischemic attack (TIA), and cerebral infarction without residual deficits: Secondary | ICD-10-CM | POA: Diagnosis not present

## 2021-08-18 DIAGNOSIS — I519 Heart disease, unspecified: Secondary | ICD-10-CM | POA: Diagnosis not present

## 2021-08-18 DIAGNOSIS — Z5181 Encounter for therapeutic drug level monitoring: Secondary | ICD-10-CM | POA: Diagnosis not present

## 2021-08-18 DIAGNOSIS — D571 Sickle-cell disease without crisis: Secondary | ICD-10-CM | POA: Diagnosis not present

## 2021-09-16 DIAGNOSIS — R69 Illness, unspecified: Secondary | ICD-10-CM | POA: Diagnosis not present

## 2021-09-16 DIAGNOSIS — F419 Anxiety disorder, unspecified: Secondary | ICD-10-CM | POA: Diagnosis not present

## 2021-09-21 DIAGNOSIS — L739 Follicular disorder, unspecified: Secondary | ICD-10-CM | POA: Diagnosis not present

## 2021-09-21 DIAGNOSIS — N921 Excessive and frequent menstruation with irregular cycle: Secondary | ICD-10-CM | POA: Diagnosis not present

## 2021-10-10 DIAGNOSIS — N920 Excessive and frequent menstruation with regular cycle: Secondary | ICD-10-CM | POA: Diagnosis not present

## 2021-10-10 DIAGNOSIS — D571 Sickle-cell disease without crisis: Secondary | ICD-10-CM | POA: Diagnosis not present

## 2021-11-09 DIAGNOSIS — D571 Sickle-cell disease without crisis: Secondary | ICD-10-CM | POA: Diagnosis not present

## 2021-11-09 DIAGNOSIS — Z0000 Encounter for general adult medical examination without abnormal findings: Secondary | ICD-10-CM | POA: Diagnosis not present

## 2021-11-09 DIAGNOSIS — R3989 Other symptoms and signs involving the genitourinary system: Secondary | ICD-10-CM | POA: Diagnosis not present

## 2021-11-09 DIAGNOSIS — D500 Iron deficiency anemia secondary to blood loss (chronic): Secondary | ICD-10-CM | POA: Diagnosis not present

## 2021-11-09 DIAGNOSIS — N920 Excessive and frequent menstruation with regular cycle: Secondary | ICD-10-CM | POA: Diagnosis not present

## 2021-11-09 DIAGNOSIS — Z23 Encounter for immunization: Secondary | ICD-10-CM | POA: Diagnosis not present

## 2021-11-09 DIAGNOSIS — Z30430 Encounter for insertion of intrauterine contraceptive device: Secondary | ICD-10-CM | POA: Diagnosis not present

## 2021-11-24 DIAGNOSIS — R809 Proteinuria, unspecified: Secondary | ICD-10-CM | POA: Diagnosis not present

## 2021-11-24 DIAGNOSIS — M8588 Other specified disorders of bone density and structure, other site: Secondary | ICD-10-CM | POA: Diagnosis not present

## 2021-11-24 DIAGNOSIS — Z131 Encounter for screening for diabetes mellitus: Secondary | ICD-10-CM | POA: Diagnosis not present

## 2021-11-24 DIAGNOSIS — Z8673 Personal history of transient ischemic attack (TIA), and cerebral infarction without residual deficits: Secondary | ICD-10-CM | POA: Diagnosis not present

## 2021-11-24 DIAGNOSIS — Z23 Encounter for immunization: Secondary | ICD-10-CM | POA: Diagnosis not present

## 2021-11-24 DIAGNOSIS — R69 Illness, unspecified: Secondary | ICD-10-CM | POA: Diagnosis not present

## 2021-11-24 DIAGNOSIS — Z823 Family history of stroke: Secondary | ICD-10-CM | POA: Diagnosis not present

## 2021-11-24 DIAGNOSIS — Z833 Family history of diabetes mellitus: Secondary | ICD-10-CM | POA: Diagnosis not present

## 2021-11-24 DIAGNOSIS — Z9081 Acquired absence of spleen: Secondary | ICD-10-CM | POA: Diagnosis not present

## 2021-11-24 DIAGNOSIS — Z8249 Family history of ischemic heart disease and other diseases of the circulatory system: Secondary | ICD-10-CM | POA: Diagnosis not present

## 2021-11-24 DIAGNOSIS — E559 Vitamin D deficiency, unspecified: Secondary | ICD-10-CM | POA: Diagnosis not present

## 2021-11-24 DIAGNOSIS — N049 Nephrotic syndrome with unspecified morphologic changes: Secondary | ICD-10-CM | POA: Diagnosis not present

## 2021-11-24 DIAGNOSIS — Z5181 Encounter for therapeutic drug level monitoring: Secondary | ICD-10-CM | POA: Diagnosis not present

## 2021-11-24 DIAGNOSIS — D571 Sickle-cell disease without crisis: Secondary | ICD-10-CM | POA: Diagnosis not present

## 2021-11-24 DIAGNOSIS — R7303 Prediabetes: Secondary | ICD-10-CM | POA: Diagnosis not present

## 2021-11-24 DIAGNOSIS — Z79899 Other long term (current) drug therapy: Secondary | ICD-10-CM | POA: Diagnosis not present

## 2021-12-27 DIAGNOSIS — R808 Other proteinuria: Secondary | ICD-10-CM | POA: Diagnosis not present

## 2021-12-27 DIAGNOSIS — Z5181 Encounter for therapeutic drug level monitoring: Secondary | ICD-10-CM | POA: Diagnosis not present

## 2021-12-27 DIAGNOSIS — D571 Sickle-cell disease without crisis: Secondary | ICD-10-CM | POA: Diagnosis not present

## 2022-02-23 DIAGNOSIS — D571 Sickle-cell disease without crisis: Secondary | ICD-10-CM | POA: Diagnosis not present

## 2022-02-23 DIAGNOSIS — Z5181 Encounter for therapeutic drug level monitoring: Secondary | ICD-10-CM | POA: Diagnosis not present

## 2022-05-04 DIAGNOSIS — R808 Other proteinuria: Secondary | ICD-10-CM | POA: Diagnosis not present

## 2022-05-04 DIAGNOSIS — M8588 Other specified disorders of bone density and structure, other site: Secondary | ICD-10-CM | POA: Diagnosis not present

## 2022-05-04 DIAGNOSIS — Z79899 Other long term (current) drug therapy: Secondary | ICD-10-CM | POA: Diagnosis not present

## 2022-05-04 DIAGNOSIS — Z8673 Personal history of transient ischemic attack (TIA), and cerebral infarction without residual deficits: Secondary | ICD-10-CM | POA: Diagnosis not present

## 2022-05-04 DIAGNOSIS — R809 Proteinuria, unspecified: Secondary | ICD-10-CM | POA: Diagnosis not present

## 2022-05-04 DIAGNOSIS — Z9081 Acquired absence of spleen: Secondary | ICD-10-CM | POA: Diagnosis not present

## 2022-05-04 DIAGNOSIS — E559 Vitamin D deficiency, unspecified: Secondary | ICD-10-CM | POA: Diagnosis not present

## 2022-05-04 DIAGNOSIS — Z5181 Encounter for therapeutic drug level monitoring: Secondary | ICD-10-CM | POA: Diagnosis not present

## 2022-05-04 DIAGNOSIS — Z833 Family history of diabetes mellitus: Secondary | ICD-10-CM | POA: Diagnosis not present

## 2022-05-04 DIAGNOSIS — I69354 Hemiplegia and hemiparesis following cerebral infarction affecting left non-dominant side: Secondary | ICD-10-CM | POA: Diagnosis not present

## 2022-05-04 DIAGNOSIS — R69 Illness, unspecified: Secondary | ICD-10-CM | POA: Diagnosis not present

## 2022-05-04 DIAGNOSIS — Z23 Encounter for immunization: Secondary | ICD-10-CM | POA: Diagnosis not present

## 2022-05-04 DIAGNOSIS — R7303 Prediabetes: Secondary | ICD-10-CM | POA: Diagnosis not present

## 2022-05-04 DIAGNOSIS — D571 Sickle-cell disease without crisis: Secondary | ICD-10-CM | POA: Diagnosis not present

## 2022-05-22 DIAGNOSIS — F419 Anxiety disorder, unspecified: Secondary | ICD-10-CM | POA: Diagnosis not present

## 2022-05-22 DIAGNOSIS — R69 Illness, unspecified: Secondary | ICD-10-CM | POA: Diagnosis not present

## 2022-06-12 ENCOUNTER — Ambulatory Visit (INDEPENDENT_AMBULATORY_CARE_PROVIDER_SITE_OTHER): Payer: Medicare HMO | Admitting: Internal Medicine

## 2022-06-12 ENCOUNTER — Encounter: Payer: Self-pay | Admitting: Internal Medicine

## 2022-06-12 VITALS — BP 120/100 | HR 79 | Temp 98.6°F | Ht 67.0 in | Wt 117.0 lb

## 2022-06-12 DIAGNOSIS — Z8673 Personal history of transient ischemic attack (TIA), and cerebral infarction without residual deficits: Secondary | ICD-10-CM | POA: Diagnosis not present

## 2022-06-12 DIAGNOSIS — D571 Sickle-cell disease without crisis: Secondary | ICD-10-CM

## 2022-06-12 NOTE — Patient Instructions (Addendum)
For the ankle brace go to [facility]   [address] [place], [place] [address]  Phone: [phone] Fax: [phone]  We will get you in with the kidney doctor

## 2022-06-12 NOTE — Progress Notes (Unsigned)
   Subjective:   Patient ID: Jo Ballard, female    DOB: [birth year], 37 y.o.   MRN: [id number]  HPI The patient is a new 37 YO female coming in for concerns.   PMH, [facility], [facility], [facility], social history reviewed and updated  Review of Systems  Constitutional: Negative.   HENT: Negative.    Eyes: Negative.   Respiratory:  Negative for cough, chest tightness and shortness of breath.   Cardiovascular:  Negative for chest pain, palpitations and leg swelling.  Gastrointestinal:  Negative for abdominal distention, abdominal pain, constipation, diarrhea, nausea and vomiting.  Musculoskeletal:  Positive for gait problem.  Skin: Negative.   Neurological:  Positive for weakness.  Psychiatric/Behavioral: Negative.      Objective:  Physical Exam Constitutional:      Appearance: She is well-developed.  HENT:     Head: Normocephalic and atraumatic.  Cardiovascular:     Rate and Rhythm: Normal rate and regular rhythm.  Pulmonary:     Effort: Pulmonary effort is normal. No respiratory distress.     Breath sounds: Normal breath sounds. No wheezing or rales.  Abdominal:     General: Bowel sounds are normal. There is no distension.     Palpations: Abdomen is soft.     Tenderness: There is no abdominal tenderness. There is no rebound.  Musculoskeletal:     Cervical back: Normal range of motion.  Skin:    General: Skin is warm and dry.  Neurological:     Mental Status: She is alert and oriented to person, place, and time.     Cranial Nerves: Cranial nerve deficit present.     Sensory: Sensory deficit present.     Motor: Weakness present.     Coordination: Coordination normal.     Comments: Left ankle brace in place     Vitals:   06/12/22 1408 06/12/22 1410  BP: (!) 120/100 (!) 120/100  Pulse: 79   Temp: 98.6 F (37 C)   TempSrc: Oral   SpO2: 98%   Weight: 117 lb (53.1 kg)   Height: 5\' 7"  (1.702 m)    Assessment & Plan:  [facility] Fax: [phone]

## 2022-06-15 DIAGNOSIS — Z8673 Personal history of transient ischemic attack (TIA), and cerebral infarction without residual deficits: Secondary | ICD-10-CM | POA: Insufficient documentation

## 2022-06-15 NOTE — Assessment & Plan Note (Addendum)
Seeing [facility] for care and will keep sickle care there. Is takng hydroxyurea 500 mg daily and folic acid daily. 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 Wrote rx for this today. Does need nephrologist locally and that referral is done. Has proteinuria and taking enalapril. Records in care everywhere with recent labs.

## 2022-06-15 NOTE — Assessment & Plan Note (Signed)
Related to sickle cell and does not need monitoring of lipids at this time except intermittent.

## 2022-06-22 ENCOUNTER — Telehealth: Payer: Self-pay | Admitting: Internal Medicine

## 2022-06-22 NOTE — Telephone Encounter (Signed)
[facility] called states a prescription for an ankle brace was sent to them on April 15 but there is not a date on the paperwork and they can't do anything without having a date. A good callback number is [phone].

## 2022-06-23 NOTE — Telephone Encounter (Signed)
Prescription resent with date.

## 2022-06-23 NOTE — Telephone Encounter (Signed)
Check faxed paperwork this was a physical rx. We can add date and re-fax

## 2022-07-04 ENCOUNTER — Ambulatory Visit: Payer: Medicare HMO | Admitting: Internal Medicine

## 2022-07-05 ENCOUNTER — Ambulatory Visit (INDEPENDENT_AMBULATORY_CARE_PROVIDER_SITE_OTHER): Payer: Medicare HMO

## 2022-07-05 ENCOUNTER — Ambulatory Visit: Payer: Medicare HMO

## 2022-07-05 DIAGNOSIS — Z23 Encounter for immunization: Secondary | ICD-10-CM | POA: Diagnosis not present

## 2022-07-05 NOTE — Progress Notes (Signed)
Pt received 2nd dosage for HPV and responded well to vaccination.

## 2022-08-04 DIAGNOSIS — M21172 Varus deformity, not elsewhere classified, left ankle: Secondary | ICD-10-CM | POA: Diagnosis not present

## 2022-08-04 DIAGNOSIS — M21372 Foot drop, left foot: Secondary | ICD-10-CM | POA: Diagnosis not present

## 2022-08-22 DIAGNOSIS — F419 Anxiety disorder, unspecified: Secondary | ICD-10-CM | POA: Diagnosis not present

## 2022-08-22 DIAGNOSIS — F251 Schizoaffective disorder, depressive type: Secondary | ICD-10-CM | POA: Diagnosis not present

## 2022-10-09 ENCOUNTER — Ambulatory Visit: Payer: Medicare HMO

## 2022-10-09 ENCOUNTER — Ambulatory Visit (INDEPENDENT_AMBULATORY_CARE_PROVIDER_SITE_OTHER): Payer: Medicare HMO | Admitting: Internal Medicine

## 2022-10-09 ENCOUNTER — Encounter: Payer: Self-pay | Admitting: Internal Medicine

## 2022-10-09 VITALS — BP 122/88 | HR 98 | Temp 98.0°F | Ht 67.0 in | Wt 118.0 lb

## 2022-10-09 DIAGNOSIS — D5703 Hb-ss disease with cerebral vascular involvement: Secondary | ICD-10-CM

## 2022-10-09 DIAGNOSIS — Z8673 Personal history of transient ischemic attack (TIA), and cerebral infarction without residual deficits: Secondary | ICD-10-CM

## 2022-10-09 DIAGNOSIS — Z0000 Encounter for general adult medical examination without abnormal findings: Secondary | ICD-10-CM | POA: Diagnosis not present

## 2022-10-09 MED ORDER — NYSTATIN-TRIAMCINOLONE 100000-0.1 UNIT/GM-% EX OINT: 1.0000 | TOPICAL_OINTMENT | Freq: Two times a day (BID) | CUTANEOUS | 0 refills | Status: DC

## 2022-10-09 NOTE — Patient Instructions (Addendum)
We have sent in the ointment to use twice a day

## 2022-10-09 NOTE — Progress Notes (Unsigned)
   Subjective:   Patient ID: Jo Ballard, female    DOB: May 01, 1985, 37 y.o.   MRN: [id number]  HPI Here for medicare wellness and physical, no new complaints. Please see A/P for status and treatment of chronic medical problems.   Diet: heart healthy Physical activity: sedentary Depression/mood screen: negative Hearing: intact to whispered voice Visual acuity: grossly normal, performs annual eye exam  ADLs: capable Fall risk: none Home safety: good Cognitive evaluation: intact to orientation, naming, recall and repetition EOL planning: adv directives discussed  Flowsheet Row Office Visit from 10/09/2022 in [facility]  PHQ-2 Total Score 0           06/12/2022    2:12 PM 10/09/2022    2:10 PM  Fall Risk  Falls in the past year? 0 0  Was there an injury with Fall? 0 0  Fall Risk Category Calculator 0 0  Patient at Risk for Falls Due to  No Fall Risks  Fall risk Follow up Falls evaluation completed Falls evaluation completed   I have personally reviewed and have noted 1. The patient's medical and social history - reviewed today no changes 2. Their use of alcohol, tobacco or illicit drugs 3. Their current medications and supplements 4. The patient's functional ability including ADL's, fall risks, home safety risks and hearing or visual impairment. 5. Diet and physical activities 6. Evidence for depression or mood disorders 7. Care team reviewed and updated 8.  The patient is not on an opioid pain medication  Patient Care Team: Myrlene Broker, MD as PCP - General (Internal Medicine) Past Medical History:  Diagnosis Date   Hypertension    Sickle cell disease (HCC)    Stroke [facility])    History reviewed. No pertinent surgical history. History reviewed. No pertinent family history.   Review of Systems  Constitutional: Negative.   HENT: Negative.    Eyes: Negative.   Respiratory:  Negative for cough, chest tightness and  shortness of breath.   Cardiovascular:  Negative for chest pain, palpitations and leg swelling.  Gastrointestinal:  Negative for abdominal distention, abdominal pain, constipation, diarrhea, nausea and vomiting.  Musculoskeletal:  Positive for gait problem.  Skin: Negative.   Psychiatric/Behavioral: Negative.      Objective:  Physical Exam Constitutional:      Appearance: She is well-developed.  HENT:     Head: Normocephalic and atraumatic.  Cardiovascular:     Rate and Rhythm: Normal rate and regular rhythm.  Pulmonary:     Effort: Pulmonary effort is normal. No respiratory distress.     Breath sounds: Normal breath sounds. No wheezing or rales.  Abdominal:     General: Bowel sounds are normal. There is no distension.     Palpations: Abdomen is soft.     Tenderness: There is no abdominal tenderness. There is no rebound.  Musculoskeletal:     Cervical back: Normal range of motion.  Skin:    General: Skin is warm and dry.  Neurological:     Mental Status: She is alert and oriented to person, place, and time.     Coordination: Coordination abnormal.     Vitals:   10/09/22 1402  BP: 122/88  Pulse: 98  Temp: 98 F (36.7 C)  TempSrc: Oral  SpO2: 96%  Weight: 118 lb (53.5 kg)  Height: 5\' 7"  (1.702 m)    Assessment & Plan:

## 2022-10-11 ENCOUNTER — Telehealth: Payer: Self-pay | Admitting: Internal Medicine

## 2022-10-11 DIAGNOSIS — Z0000 Encounter for general adult medical examination without abnormal findings: Secondary | ICD-10-CM | POA: Insufficient documentation

## 2022-10-11 NOTE — Assessment & Plan Note (Signed)
Flu shot yearly. Tetanus up to date. HPV scheduled 3rd vaccine. Pap smear up to date with gyn. Counseled about sun safety and mole surveillance. Counseled about the dangers of distracted driving. Given 10 year screening recommendations.

## 2022-10-11 NOTE — Assessment & Plan Note (Signed)
Still seeing hematology and no crisis today.

## 2022-10-11 NOTE — Telephone Encounter (Signed)
Patient's pharmacy said the patient needs a prior authorization for nystatin-triamcinolone ointment (MYCOLOG). Best callback is [phone].

## 2022-10-11 NOTE — Assessment & Plan Note (Signed)
Overall stable and was able to get ankle brace since our last visit.

## 2022-10-12 ENCOUNTER — Other Ambulatory Visit (HOSPITAL_COMMUNITY): Payer: Self-pay

## 2022-10-12 ENCOUNTER — Telehealth: Payer: Self-pay

## 2022-10-12 ENCOUNTER — Encounter (INDEPENDENT_AMBULATORY_CARE_PROVIDER_SITE_OTHER): Payer: Self-pay

## 2022-10-12 NOTE — Telephone Encounter (Signed)
Pharmacy Patient Advocate Encounter   Received notification from Pt Calls Messages that prior authorization for Nystatin-triamcinolone ointment is required/requested.   Insurance verification completed.   The patient is insured through  [employer]  .    Nystatin ointment and Triamcinolone ointment is preferred by the ins.  If suggested medication is appropriate, Please send in a new RX and discontinue this one. If not, please advise as to why it's not appropriate so that we may request a Prior Authorization.   Per test claim: both the nystatin ointment and triamcinolone ointment came back as a $0 copay.

## 2022-10-16 ENCOUNTER — Other Ambulatory Visit (HOSPITAL_COMMUNITY): Payer: Self-pay

## 2022-10-16 ENCOUNTER — Telehealth: Payer: Self-pay

## 2022-10-16 NOTE — Telephone Encounter (Signed)
Pharmacy Patient Advocate Encounter   Received notification from Pt Calls Messages that prior authorization for Nystatin-triamcinolone ointment is required/requested.   Insurance verification completed.   The patient is insured through [facility] .   Per test claim: PA required; PA submitted to [facility] via CoverMyMeds Key/confirmation #/EOC [id number] Status is pending

## 2022-10-18 NOTE — Telephone Encounter (Signed)
Pharmacy Patient Advocate Encounter  Received notification from [employer] that Prior Authorization for Nystatin-triamcinolone ointment has been DENIED. Please advise how you'd like to proceed. Full denial letter will be uploaded to the media tab. See denial reason below.   PA #/Case ID/Reference #: [id number]

## 2022-10-23 ENCOUNTER — Telehealth: Payer: Self-pay | Admitting: Internal Medicine

## 2022-10-23 MED ORDER — TRIAMCINOLONE ACETONIDE 0.5 % EX OINT: 1.0000 | TOPICAL_OINTMENT | Freq: Two times a day (BID) | CUTANEOUS | 0 refills | Status: AC

## 2022-10-23 MED ORDER — NYSTATIN 100000 UNIT/GM EX OINT: 1.0000 | TOPICAL_OINTMENT | Freq: Two times a day (BID) | CUTANEOUS | 0 refills | Status: AC

## 2022-10-23 NOTE — Telephone Encounter (Signed)
Informed patient about sending in medication separately

## 2022-10-23 NOTE — Telephone Encounter (Signed)
This was a combination medicine with 2 ingredients in it. Typically insurance will pay for two separate creams have sent in.

## 2022-10-23 NOTE — Telephone Encounter (Signed)
Patient was prescribed nystatin - insurance will not pay for it - please send in an alternative - Please send to [facility] on [address].

## 2022-11-06 ENCOUNTER — Ambulatory Visit: Payer: Medicare HMO

## 2022-11-06 DIAGNOSIS — Z23 Encounter for immunization: Secondary | ICD-10-CM | POA: Diagnosis not present

## 2022-11-06 NOTE — Progress Notes (Signed)
After obtaining consent, and per orders of Dr. Okey Dupre, injection of HPV given by Ferdie Ping. Patient instructed to report any adverse reaction to me immediately.

## 2023-01-04 ENCOUNTER — Ambulatory Visit: Payer: Medicare HMO | Admitting: Internal Medicine

## 2023-01-05 ENCOUNTER — Ambulatory Visit: Payer: Medicare HMO

## 2023-01-16 ENCOUNTER — Encounter: Payer: Self-pay | Admitting: Internal Medicine

## 2023-01-16 ENCOUNTER — Ambulatory Visit (INDEPENDENT_AMBULATORY_CARE_PROVIDER_SITE_OTHER): Payer: Medicare HMO | Admitting: Internal Medicine

## 2023-01-16 VITALS — BP 128/100 | HR 86 | Temp 98.0°F | Ht 67.0 in | Wt 123.0 lb

## 2023-01-16 DIAGNOSIS — F99 Mental disorder, not otherwise specified: Secondary | ICD-10-CM | POA: Insufficient documentation

## 2023-01-16 DIAGNOSIS — Z23 Encounter for immunization: Secondary | ICD-10-CM | POA: Diagnosis not present

## 2023-01-16 NOTE — Assessment & Plan Note (Signed)
Referral to psychiatry. Previously seen at [facility] and we do not have records to review exact diagnosis. She was told she needs another location due to insurance coverage. She is stable on abilify 5 mg daily and prozac 20 mg daily and risperdal 1 mg daily.

## 2023-01-16 NOTE — Progress Notes (Signed)
   Subjective:   Patient ID: Jo Ballard, female    DOB: May 20, 1985, 37 y.o.   MRN: [id number]  HPI The patient is a 37 YO female coming in for needing new mental health provider. She is taking abilify and prozac and risperdal unknown about specific reason for treatment.   Review of Systems  Constitutional: Negative.   HENT: Negative.    Eyes: Negative.   Respiratory:  Negative for cough, chest tightness and shortness of breath.   Cardiovascular:  Negative for chest pain, palpitations and leg swelling.  Gastrointestinal:  Negative for abdominal distention, abdominal pain, constipation, diarrhea, nausea and vomiting.  Musculoskeletal: Negative.   Skin: Negative.   Neurological: Negative.   Psychiatric/Behavioral: Negative.      Objective:  Physical Exam Constitutional:      Appearance: She is well-developed.  HENT:     Head: Normocephalic and atraumatic.  Cardiovascular:     Rate and Rhythm: Normal rate and regular rhythm.  Pulmonary:     Effort: Pulmonary effort is normal. No respiratory distress.     Breath sounds: Normal breath sounds. No wheezing or rales.  Abdominal:     General: Bowel sounds are normal. There is no distension.     Palpations: Abdomen is soft.     Tenderness: There is no abdominal tenderness. There is no rebound.  Musculoskeletal:     Cervical back: Normal range of motion.  Skin:    General: Skin is warm and dry.  Neurological:     Mental Status: She is alert and oriented to person, place, and time.     Coordination: Coordination normal.     Vitals:   01/16/23 1440  BP: (!) 128/100  Pulse: 86  Temp: 98 F (36.7 C)  TempSrc: Oral  SpO2: 98%  Weight: 123 lb (55.8 kg)  Height: 5\' 7"  (1.702 m)    Assessment & Plan:  Flu shot given at visit

## 2023-04-11 ENCOUNTER — Ambulatory Visit: Payer: Medicare Other

## 2023-04-11 ENCOUNTER — Ambulatory Visit: Payer: Medicare Other | Admitting: Internal Medicine

## 2023-04-11 ENCOUNTER — Other Ambulatory Visit: Payer: Self-pay | Admitting: Internal Medicine

## 2023-04-11 DIAGNOSIS — Z23 Encounter for immunization: Secondary | ICD-10-CM

## 2023-04-11 MED ORDER — HYDROXYUREA 500 MG PO CAPS: 500.0000 mg | ORAL_CAPSULE | Freq: Three times a day (TID) | ORAL | 0 refills | Status: DC

## 2023-04-11 NOTE — Progress Notes (Deleted)
   Subjective:   Patient ID: Jo Ballard, female    DOB: 1986/02/22, 38 y.o.   MRN: [id number]  HPI   Review of Systems  Objective:  Physical Exam  There were no vitals filed for this visit.  Assessment & Plan:

## 2023-04-11 NOTE — Progress Notes (Signed)
Entered in error

## 2023-04-16 NOTE — Progress Notes (Signed)
 Patient has received her Flu vaccine and responded well

## 2023-05-02 ENCOUNTER — Other Ambulatory Visit: Payer: Self-pay | Admitting: Internal Medicine

## 2023-06-01 ENCOUNTER — Other Ambulatory Visit: Payer: Self-pay | Admitting: Internal Medicine

## 2023-07-01 ENCOUNTER — Other Ambulatory Visit: Payer: Self-pay | Admitting: Internal Medicine

## 2023-10-01 ENCOUNTER — Other Ambulatory Visit: Payer: Self-pay | Admitting: Internal Medicine

## 2023-10-09 ENCOUNTER — Ambulatory Visit (INDEPENDENT_AMBULATORY_CARE_PROVIDER_SITE_OTHER): Admitting: Internal Medicine

## 2023-10-09 ENCOUNTER — Encounter: Payer: Self-pay | Admitting: Internal Medicine

## 2023-10-09 VITALS — BP 132/80 | HR 85 | Temp 98.6°F | Ht 67.0 in | Wt 118.0 lb

## 2023-10-09 DIAGNOSIS — M79675 Pain in left toe(s): Secondary | ICD-10-CM | POA: Diagnosis not present

## 2023-10-09 MED ORDER — DICLOFENAC SODIUM 1 % EX GEL: 2.0000 g | Freq: Four times a day (QID) | CUTANEOUS | 0 refills | Status: AC

## 2023-10-09 NOTE — Patient Instructions (Signed)
 We have sent in voltaren  gel (diclofenac  gel) to use on the toe 3 times a day.

## 2023-10-11 DIAGNOSIS — M79675 Pain in left toe(s): Secondary | ICD-10-CM | POA: Insufficient documentation

## 2023-10-11 NOTE — Progress Notes (Signed)
   Subjective:   Patient ID: Jo Ballard, female    DOB: 1985-05-07, 38 y.o.   MRN: [id number]  Discussed the use of AI scribe software for clinical note transcription with the patient, who gave verbal consent to proceed.  History of Present Illness Jo Ballard is a 38 year old female who presents with pain in the right pinky toe.  She experiences pain in her right pinky toe that occurs during walking and persists even when not bearing weight. The pain began spontaneously last week without any preceding trauma or increase in physical activity. It is described as severe when pressure is applied, such as when wearing shoes.  She believes the pain may be due to her shoe rubbing against the toe, especially since she wears a brace that might alter the fit of her shoe. The pain is localized to the pinky toe and the tip of the toe, with no involvement of other toes, the midfoot, or the ankle.  She has tried using Biofreeze for relief, but it has not been effective. Some relief is found when lying down and avoiding pressure on the toe. She also attempts to protect the toe by curling her stockings to reduce pressure from the shoe.  No recent increase in exercise or incidents of stubbing or injuring the toe. The area appears swollen but does not feel tight internally. No pain in other toes, the midfoot, or the ankle.  Review of Systems  Constitutional: Negative.   HENT: Negative.    Eyes: Negative.   Respiratory:  Negative for cough, chest tightness and shortness of breath.   Cardiovascular:  Negative for chest pain, palpitations and leg swelling.  Gastrointestinal:  Negative for abdominal distention, abdominal pain, constipation, diarrhea, nausea and vomiting.  Musculoskeletal:  Positive for arthralgias.  Skin: Negative.   Neurological: Negative.   Psychiatric/Behavioral: Negative.      Objective:  Physical Exam Constitutional:      Appearance: She is well-developed.  HENT:     Head:  Normocephalic and atraumatic.  Cardiovascular:     Rate and Rhythm: Normal rate and regular rhythm.  Pulmonary:     Effort: Pulmonary effort is normal. No respiratory distress.     Breath sounds: Normal breath sounds. No wheezing or rales.  Abdominal:     General: Bowel sounds are normal. There is no distension.     Palpations: Abdomen is soft.     Tenderness: There is no abdominal tenderness. There is no rebound.  Musculoskeletal:        General: Tenderness present.     Cervical back: Normal range of motion.  Skin:    General: Skin is warm and dry.  Neurological:     Mental Status: She is alert and oriented to person, place, and time.     Coordination: Coordination normal.     Vitals:   10/09/23 0946  BP: 132/80  Pulse: 85  Temp: 98.6 F (37 C)  SpO2: 97%  Weight: 118 lb (53.5 kg)  Height: 5' 7 (1.702 m)    Assessment and Plan Assessment & Plan Left fifth toe pain   Pain is likely due to shoe and brace pressure, worsened by activity and relieved by rest. Swelling is present. A stress fracture is less likely without trauma. Prescribe Voltaren  gel for inflammation and pain. Consider an x-ray if symptoms persist. Discuss wider shoes to reduce pressure.

## 2023-10-11 NOTE — Assessment & Plan Note (Signed)
 Pain is likely due to shoe and brace pressure, worsened by activity and relieved by rest. Swelling is present. A stress fracture is less likely without trauma. Prescribe Voltaren  gel for inflammation and pain. Consider an x-ray if symptoms persist. Discuss wider shoes to reduce pressure. She is asking about referral to chiropractor or recommendation for specific one but explained that we do not typically refer to chiropractor and I do not know any additional information beyond what it publicly available on google. We offered referral to podiatry.

## 2023-11-13 ENCOUNTER — Other Ambulatory Visit: Payer: Self-pay | Admitting: Internal Medicine

## 2024-02-27 ENCOUNTER — Other Ambulatory Visit: Payer: Self-pay | Admitting: Internal Medicine
